# Patient Record
Sex: Female | Born: 2000
Health system: Southern US, Community
[De-identification: ages and names within clinical notes are randomized; demographics above are authoritative.]

## PROBLEM LIST (undated history)

## (undated) DIAGNOSIS — T7840XA Allergy, unspecified, initial encounter: Secondary | ICD-10-CM

## (undated) HISTORY — DX: Allergy, unspecified, initial encounter: T78.40XA

---
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
## 2020-01-10 ENCOUNTER — Encounter: Payer: Self-pay | Admitting: Physician Assistant

## 2020-01-11 DIAGNOSIS — H6121 Impacted cerumen, right ear: Secondary | ICD-10-CM | POA: Diagnosis not present

## 2020-01-11 DIAGNOSIS — H9201 Otalgia, right ear: Secondary | ICD-10-CM | POA: Diagnosis not present

## 2020-01-11 DIAGNOSIS — J0100 Acute maxillary sinusitis, unspecified: Secondary | ICD-10-CM | POA: Diagnosis not present

## 2020-01-12 NOTE — Progress Notes (Deleted)
     Established patient visit   Patient: Victoria Webb   DOB: 2000-08-23   18 y.o. Female  MRN: [id number] Visit Date: 01/13/2020  Today's healthcare provider: Margaretann Loveless, PA-C   No chief complaint on file.  Subjective    HPI ***  {Show patient history (optional):23778::" "}   Medications: Outpatient Medications Prior to Visit  Medication Sig  . cetirizine (ZYRTEC) 10 MG tablet Take 10 mg by mouth as needed.  Marland Kitchen ketoconazole (NIZORAL) 2 % shampoo APPLY 1 APPLICATION TOPICALLY 2 (TWO) TIMES A WEEK.  . terbinafine (LAMISIL) 1 % cream Apply 1 application topically 2 (two) times daily.  . TRI-LO-MARZIA 0.18/0.215/0.25 MG-25 MCG tab TAKE 1 TABLET BY MOUTH EVERY DAY   No facility-administered medications prior to visit.    Review of Systems  {Heme  Chem  Endocrine  Serology  Results Review (optional):23779::" "}  Objective    There were no vitals taken for this visit. {Show previous vital signs (optional):23777::" "}  Physical Exam  ***  No results found for any visits on 01/13/20.  Assessment & Plan     ***  No follow-ups on file.      {provider attestation***:1}   Reine Just  [facility] [phone] (phone) [phone] (fax)  [facility]

## 2020-01-13 ENCOUNTER — Other Ambulatory Visit: Payer: Self-pay

## 2020-01-13 ENCOUNTER — Ambulatory Visit: Payer: Self-pay | Admitting: Physician Assistant

## 2020-01-13 ENCOUNTER — Telehealth (INDEPENDENT_AMBULATORY_CARE_PROVIDER_SITE_OTHER): Payer: BC Managed Care – PPO | Admitting: Family Medicine

## 2020-01-13 ENCOUNTER — Encounter: Payer: Self-pay | Admitting: Family Medicine

## 2020-01-13 DIAGNOSIS — J0100 Acute maxillary sinusitis, unspecified: Secondary | ICD-10-CM

## 2020-01-13 MED ORDER — AZITHROMYCIN 250 MG PO TABS: ORAL_TABLET | ORAL | 0 refills | Status: DC

## 2020-01-13 NOTE — Progress Notes (Signed)
MyChart Video Visit    Virtual Visit via Video Note   This visit type was conducted due to national recommendations for restrictions regarding the COVID-19 Pandemic (e.g. social distancing) in an effort to limit this patient's exposure and mitigate transmission in our community. This patient is at least at moderate risk for complications without adequate follow up. This format is felt to be most appropriate for this patient at this time. Physical exam was limited by quality of the video and audio technology used for the visit.   Patient location: Home Provider location: Office   Patient: Victoria Webb   DOB: 08-Apr-2001   19 y.o. Female  MRN: [id number] Visit Date: 01/13/2020  Today's healthcare provider: Dortha Kern, PA   Chief Complaint  Patient presents with   Ear Pain   Subjective    HPI This 19 year old female developed rhinorrhea with sore throat after a trip to the beach last week. Went to an Urgent Care clinic on 01-10-20 and was given Allegra for suspected sinusitis with allergic rhinitis. Progressed to a cough with PND, ear ache, sneezing, stuffy nose and pressure under eyes. No fever or loss of taste/smell.  There are no problems to display for this patient.  Past Medical History:  Diagnosis Date   Allergy    Seasonal   No past surgical history on file. Social History   Tobacco Use   Smoking status: Never Smoker   Smokeless tobacco: Never Used  Substance Use Topics   Alcohol use: Never   Drug use: Never   Family Status  Relation Name Status   Mother  (Not Specified)   Father  (Not Specified)   Sister  (Not Specified)   Dennie Bible Aunt  (Not Specified)   MGM  (Not Specified)   MGF  (Not Specified)   PGM  (Not Specified)   PGF  (Not Specified)   Cousin  (Not Specified)   Allergies  Allergen Reactions   Augmentin [Amoxicillin-Pot Clavulanate] Nausea Only      Medications: Outpatient Medications Prior to Visit  Medication Sig     cetirizine (ZYRTEC) 10 MG tablet Take 10 mg by mouth as needed.   ketoconazole (NIZORAL) 2 % shampoo APPLY 1 APPLICATION TOPICALLY 2 (TWO) TIMES A WEEK.   terbinafine (LAMISIL) 1 % cream Apply 1 application topically 2 (two) times daily.   TRI-LO-MARZIA 0.18/0.215/0.25 MG-25 MCG tab TAKE 1 TABLET BY MOUTH EVERY DAY   No facility-administered medications prior to visit.    Review of Systems  Constitutional: Negative.   HENT: Positive for congestion, ear pain, postnasal drip, rhinorrhea, sinus pressure and sore throat.   Eyes: Negative.   Respiratory: Negative.   Cardiovascular: Negative.   Gastrointestinal: Negative.     Objective    There were no vitals taken for this visit. LMP 5 weeks ago (on BCP that cycles every 3 months.  Physical Exam: WDWN female in no apparent distress.  Head: Normocephalic, atraumatic. Neck: Supple, NROM Respiratory: No apparent distress Psych: Normal mood and affect Throat: some cobblestone appearance to posterior pharynx.   Assessment & Plan     1. Subacute maxillary sinusitis Developed maxillary sinus pressure with purulent PND/nasal mucus. No fever or other COVID symptoms. Continue Allegra, add Mucinex-DM and Z-pak. Increase fluid intake and may use Tylenol or Advil prn aches or pains. Continue BCP that 3 month cycles and made aware of possible ovulation while on antibiotics. Recheck prn. - azithromycin (ZITHROMAX) 250 MG tablet; Take 2 tablets by mouth  the first day then 1 tablet daily for 4 days to complete the full treatment.  Dispense: 6 tablet; Refill: 0   No follow-ups on file.     I discussed the assessment and treatment plan with the patient. The patient was provided an opportunity to ask questions and all were answered. The patient agreed with the plan and demonstrated an understanding of the instructions.   The patient was advised to call back or seek an in-person evaluation if the symptoms worsen or if the condition fails to  improve as anticipated.  I provided 15 minutes of non-face-to-face time during this encounter.    Vernie Murders, [place] [phone] (phone) [phone] (fax)  [place]

## 2020-01-17 DIAGNOSIS — N9089 Other specified noninflammatory disorders of vulva and perineum: Secondary | ICD-10-CM | POA: Diagnosis not present

## 2020-08-02 ENCOUNTER — Other Ambulatory Visit: Payer: BC Managed Care – PPO

## 2020-08-02 DIAGNOSIS — Z20822 Contact with and (suspected) exposure to covid-19: Secondary | ICD-10-CM

## 2020-08-04 LAB — NOVEL CORONAVIRUS, NAA: SARS-CoV-2, NAA: NOT DETECTED

## 2020-08-04 LAB — SARS-COV-2, NAA 2 DAY TAT

## 2020-08-08 ENCOUNTER — Telehealth (INDEPENDENT_AMBULATORY_CARE_PROVIDER_SITE_OTHER): Payer: BC Managed Care – PPO | Admitting: Physician Assistant

## 2020-08-08 DIAGNOSIS — J0110 Acute frontal sinusitis, unspecified: Secondary | ICD-10-CM | POA: Diagnosis not present

## 2020-08-08 DIAGNOSIS — R059 Cough, unspecified: Secondary | ICD-10-CM

## 2020-08-08 DIAGNOSIS — J069 Acute upper respiratory infection, unspecified: Secondary | ICD-10-CM

## 2020-08-08 MED ORDER — AMOXICILLIN 875 MG PO TABS: 875.0000 mg | ORAL_TABLET | Freq: Two times a day (BID) | ORAL | 0 refills | Status: AC

## 2020-08-08 MED ORDER — BENZONATATE 100 MG PO CAPS: 100.0000 mg | ORAL_CAPSULE | Freq: Two times a day (BID) | ORAL | 0 refills | Status: AC

## 2020-08-08 NOTE — Patient Instructions (Signed)

## 2020-08-08 NOTE — Progress Notes (Signed)
MyChart Video Visit    Virtual Visit via Video Note   This visit type was conducted due to national recommendations for restrictions regarding the COVID-19 Pandemic (e.g. social distancing) in an effort to limit this patient's exposure and mitigate transmission in our community. This patient is at least at moderate risk for complications without adequate follow up. This format is felt to be most appropriate for this patient at this time. Physical exam was limited by quality of the video and audio technology used for the visit.   Patient location: Home Provider location: Office   I discussed the limitations of evaluation and management by telemedicine and the availability of in person appointments. The patient expressed understanding and agreed to proceed.  Patient: Victoria Webb   DOB: 2001/06/11   19 y.o. Female  MRN: [id number] Visit Date: 08/08/2020  Today's healthcare provider: Trey Sailors, PA-C   Chief Complaint  Patient presents with  . URI  I,Victoria Webb,acting as a scribe for [employer], PA-C.,have documented all relevant documentation on the behalf of Victoria Sailors, PA-C,as directed by  Victoria Sailors, PA-C while in the presence of Victoria Sailors, PA-C.  Subjective    URI  This is a new problem. The current episode started 1 to 4 weeks ago. There has been no fever. Associated symptoms include congestion, coughing, headaches, rhinorrhea and a sore throat. Pertinent negatives include no diarrhea, ear pain, nausea, sinus pain, vomiting or wheezing. She has tried antihistamine and acetaminophen for the symptoms. The treatment provided mild relief.    She reports getting her COVID booster on 08/04/2020 and woke up with fever that night which continued since then. Reports she had some sweating last night. She reports today she has had a headache and tooth pain. She tested negative for COVID on 08/01/2020 and 08/03/2020 which was required by her school.      Medications: Outpatient Medications Prior to Visit  Medication Sig  . cetirizine (ZYRTEC) 10 MG tablet Take 10 mg by mouth as needed.  Marland Kitchen ketoconazole (NIZORAL) 2 % shampoo APPLY 1 APPLICATION TOPICALLY 2 (TWO) TIMES A WEEK.  . terbinafine (LAMISIL) 1 % cream Apply 1 application topically 2 (two) times daily.  . TRI-LO-MARZIA 0.18/0.215/0.25 MG-25 MCG tab TAKE 1 TABLET BY MOUTH EVERY DAY  . azithromycin (ZITHROMAX) 250 MG tablet Take 2 tablets by mouth the first day then 1 tablet daily for 4 days to complete the full treatment. (Patient not taking: Reported on 08/08/2020)   No facility-administered medications prior to visit.    Review of Systems  Constitutional: Positive for chills. Negative for appetite change and fatigue.  HENT: Positive for congestion, postnasal drip, rhinorrhea, sinus pressure and sore throat. Negative for ear pain and sinus pain.   Respiratory: Positive for cough. Negative for chest tightness, shortness of breath and wheezing.   Gastrointestinal: Negative for diarrhea, nausea and vomiting.  Neurological: Positive for headaches. Negative for weakness.      Objective    There were no vitals taken for this visit.   Physical Exam Constitutional:      Appearance: Normal appearance.  Pulmonary:     Effort: Pulmonary effort is normal. No respiratory distress.  Neurological:     Mental Status: She is alert.  Psychiatric:        Mood and Affect: Mood normal.        Behavior: Behavior normal.        Assessment & Plan    1.  Cough  - amoxicillin (AMOXIL) 875 MG tablet; Take 1 tablet (875 mg total) by mouth 2 (two) times daily for 10 days.  Dispense: 20 tablet; Refill: 0 - benzonatate (TESSALON PERLES) 100 MG capsule; Take 1 capsule (100 mg total) by mouth 2 (two) times daily for 7 days.  Dispense: 14 capsule; Refill: 0  2. Viral URI   3. Acute non-recurrent frontal sinusitis  Advised to wait total of 10 days before starting abx and only then if  severe worsening or double sickening.   - amoxicillin (AMOXIL) 875 MG tablet; Take 1 tablet (875 mg total) by mouth 2 (two) times daily for 10 days.  Dispense: 20 tablet; Refill: 0 - benzonatate (TESSALON PERLES) 100 MG capsule; Take 1 capsule (100 mg total) by mouth 2 (two) times daily for 7 days.  Dispense: 14 capsule; Refill: 0   No follow-ups on file.     I discussed the assessment and treatment plan with the patient. The patient was provided an opportunity to ask questions and all were answered. The patient agreed with the plan and demonstrated an understanding of the instructions.   The patient was advised to call back or seek an in-person evaluation if the symptoms worsen or if the condition fails to improve as anticipated.   ITrey Sailors, PA-C, have reviewed all documentation for this visit. The documentation on 08/08/20 for the exam, diagnosis, procedures, and orders are all accurate and complete.  The entirety of the information documented in the History of Present Illness, Review of Systems and Physical Exam were personally obtained by me. Portions of this information were initially documented by [facility] and reviewed by me for thoroughness and accuracy.    Victoria Webb [facility] [phone] (phone) [phone] (fax)  [facility]

## 2020-10-30 ENCOUNTER — Encounter: Payer: Self-pay | Admitting: Physician Assistant

## 2020-11-03 DIAGNOSIS — J310 Chronic rhinitis: Secondary | ICD-10-CM | POA: Diagnosis not present

## 2020-11-03 DIAGNOSIS — R059 Cough, unspecified: Secondary | ICD-10-CM | POA: Diagnosis not present

## 2020-11-03 DIAGNOSIS — J301 Allergic rhinitis due to pollen: Secondary | ICD-10-CM | POA: Diagnosis not present

## 2020-11-03 DIAGNOSIS — J329 Chronic sinusitis, unspecified: Secondary | ICD-10-CM | POA: Diagnosis not present

## 2020-11-30 DIAGNOSIS — Z20822 Contact with and (suspected) exposure to covid-19: Secondary | ICD-10-CM | POA: Diagnosis not present

## 2020-12-12 ENCOUNTER — Ambulatory Visit: Payer: BC Managed Care – PPO | Admitting: Family Medicine

## 2020-12-12 ENCOUNTER — Encounter: Payer: Self-pay | Admitting: Family Medicine

## 2020-12-12 ENCOUNTER — Telehealth (INDEPENDENT_AMBULATORY_CARE_PROVIDER_SITE_OTHER): Payer: BC Managed Care – PPO | Admitting: Family Medicine

## 2020-12-12 VITALS — Ht 65.0 in | Wt 155.0 lb

## 2020-12-12 DIAGNOSIS — L700 Acne vulgaris: Secondary | ICD-10-CM | POA: Diagnosis not present

## 2020-12-12 DIAGNOSIS — J0100 Acute maxillary sinusitis, unspecified: Secondary | ICD-10-CM | POA: Diagnosis not present

## 2020-12-12 MED ORDER — DOXYCYCLINE HYCLATE 100 MG PO TABS: 100.0000 mg | ORAL_TABLET | Freq: Two times a day (BID) | ORAL | 0 refills | Status: AC

## 2020-12-12 MED ORDER — CLINDAMYCIN PHOS-BENZOYL PEROX 1-5 % EX GEL
Freq: Two times a day (BID) | CUTANEOUS | 1 refills | Status: DC
Start: 2020-12-12 — End: 2021-07-09

## 2020-12-12 NOTE — Patient Instructions (Signed)
Acne  Acne is a skin problem that causes pimples and other skin changes. The skin has many tiny openings called pores. Each pore contains an oil gland. Oil glands make an oily substance that is called sebum. Acne occurs when the pores in the skin get blocked. The pores may become infected with bacteria, or they may become red, sore, and swollen. Acne is a common skin problem, especially for teenagers. It often occurs on the face, neck, chest, upper arms, and back. Acne usually goes away over time. What are the causes? Acne is caused when oil glands get blocked with sebum, dead skin cells, and dirt. The bacteria that are normally found in the oil glands then multiply and cause inflammation. Acne is commonly triggered by changes in your hormones. These hormonal changes can cause the oil glands to get bigger and to make more sebum. Factors that can make acne worse include:  Hormone changes during: ? Adolescence. ? Women's menstrual cycles. ? Pregnancy.  Oil-based cosmetics and hair products.  Stress.  Hormone problems that are caused by certain diseases.  Certain medicines.  Pressure from headbands, backpacks, or shoulder pads.  Exposure to certain oils and chemicals.  Eating a diet high in carbohydrates that quickly turn to sugar. These include dairy products, desserts, and chocolates. What increases the risk? This condition is more likely to develop in:  Teenagers.  People who have a family history of acne. What are the signs or symptoms? Symptoms include:  Small, red bumps (pimples or papules).  Whiteheads.  Blackheads.  Small, pus-filled pimples (pustules).  Big, red pimples or pustules that feel tender. More severe acne can cause:  An abscess. This is an infected area that contains a collection of pus.  Cysts. These are hard, painful, fluid-filled sacs.  Scars. These can happen after large pimples heal. How is this diagnosed? This condition is diagnosed with a  medical history and physical exam. Blood tests may also be done. How is this treated? Treatment for this condition can vary depending on the severity of your acne. Treatment may include:  Creams and lotions that prevent oil glands from clogging.  Creams and lotions that treat or prevent infections and inflammation.  Antibiotic medicines that are applied to the skin or taken as a pill.  Pills that decrease sebum production.  Birth control pills.  Light or laser treatments.  Injections of medicine into the affected areas.  Chemicals that cause peeling of the skin.  Surgery. Your health care provider will also recommend the best way to take care of your skin. Good skin care is the most important part of treatment. Follow these instructions at home: Skin care Take care of your skin as told by your health care provider. You may be told to do these things:  Wash your skin gently at least two times each day, as well as: ? After you exercise. ? Before you go to bed.  Use mild soap.  Apply a water-based skin moisturizer after you wash your skin.  Use a sunscreen or sunblock with SPF 30 or greater. This is especially important if you are using acne medicines.  Choose cosmetics that will not block your oil glands (are noncomedogenic). Medicines  Take over-the-counter and prescription medicines only as told by your health care provider.  If you were prescribed an antibiotic medicine, apply it or take it as told by your health care provider. Do not stop using the antibiotic even if your condition improves. General instructions  Keep your   hair clean and off your face. If you have oily hair, shampoo your hair regularly or daily.  Avoid wearing tight headbands or hats.  Avoid picking or squeezing your pimples. That can make your acne worse and cause scarring.  Shave gently and only when necessary.  Keep a food journal to figure out if any foods are linked to your acne. Avoid dairy  products, desserts, and chocolates.  Take steps to manage and reduce stress.  Keep all follow-up visits as told by your health care provider. This is important. Contact a health care provider if:  Your acne is not better after eight weeks.  Your acne gets worse.  You have a large area of skin that is red or tender.  You think that you are having side effects from any acne medicine. Summary  Acne is a skin problem that causes pimples and other skin changes. Acne is a common skin problem, especially for teenagers. Acne usually goes away over time.  Acne is commonly triggered by changes in your hormones. There are many other causes, such as stress, diet, and certain medicines.  Follow your health care provider's instructions for how to take care of your skin. Good skin care is the most important part of treatment.  Take over-the-counter and prescription medicines only as told by your health care provider.  Contact your health care provider if you think that you are having side effects from any acne medicine. This information is not intended to replace advice given to you by your health care provider. Make sure you discuss any questions you have with your health care provider. Document Revised: 11/25/2017 Document Reviewed: 11/25/2017 Elsevier Patient Education  2021 [employer].

## 2020-12-12 NOTE — Progress Notes (Signed)
MyChart Video Visit     Virtual Visit via Video Note   This visit type was conducted due to national recommendations for restrictions regarding the COVID-19 Pandemic (e.g. social distancing) in an effort to limit this patient's exposure and mitigate transmission in our community. This patient is at least at moderate risk for complications without adequate follow up. This format is felt to be most appropriate for this patient at this time. Physical exam was limited by quality of the video and audio technology used for the visit.    Patient location: home Provider location: [facility] Persons involved in the visit: patient, provider  I discussed the limitations of evaluation and management by telemedicine and the availability of in person appointments. The patient expressed understanding and agreed to proceed.  Patient: Victoria Webb   DOB: 2000/10/23   19 y.o. Female  MRN: [id number] Visit Date: 12/12/2020  Today's healthcare provider: Shirlee Latch, MD   Chief Complaint  Patient presents with  . Rash  . Sinusitis   Subjective    HPI  Patient reports that during the summer she gets acne on her chest, back and shoulders from using sunscreen.   Upper respiratory symptoms She complains of right ear pressure/pain, congestion and purulent nasal discharge, cough and green phlegm, with no fever, chills, night sweats or weight loss. Onset of symptoms was several days ago and worsening.She is drinking plenty of fluids.  Past history is significant for no history of pneumonia or bronchitis. Patient is non-smoker. Patient has been using Ibuprofen for body aches.  Symptoms started 7-10 days with sinus pressure No fever, SoB, wheezing Cough is productive of green sputum ---------------------------------------------------------------------------------------------------  There are no problems to display for this patient.  Social History   Tobacco Use  .  Smoking status: Never Smoker  . Smokeless tobacco: Never Used  Substance Use Topics  . Alcohol use: Never  . Drug use: Never   Allergies  Allergen Reactions  . Augmentin [Amoxicillin-Pot Clavulanate] Nausea Only      Medications: Outpatient Medications Prior to Visit  Medication Sig  . TRI-LO-MARZIA 0.18/0.215/0.25 MG-25 MCG tab TAKE 1 TABLET BY MOUTH EVERY DAY  . [DISCONTINUED] azithromycin (ZITHROMAX) 250 MG tablet Take 2 tablets by mouth the first day then 1 tablet daily for 4 days to complete the full treatment. (Patient not taking: Reported on 08/08/2020)  . [DISCONTINUED] cetirizine (ZYRTEC) 10 MG tablet Take 10 mg by mouth as needed.  . [DISCONTINUED] ketoconazole (NIZORAL) 2 % shampoo APPLY 1 APPLICATION TOPICALLY 2 (TWO) TIMES A WEEK.  . [DISCONTINUED] terbinafine (LAMISIL) 1 % cream Apply 1 application topically 2 (two) times daily.   No facility-administered medications prior to visit.    Review of Systems  Constitutional: Positive for appetite change and fatigue. Negative for chills and fever.  HENT: Positive for congestion, ear pain, rhinorrhea, sinus pressure and sore throat.   Respiratory: Negative for cough and shortness of breath.   Cardiovascular: Negative for chest pain and palpitations.  Musculoskeletal: Positive for myalgias.  Neurological: Positive for headaches.      Objective    Ht 5\' 5"  (1.651 m)   Wt 155 lb (70.3 kg)   LMP 08/28/2020 (Approximate)   BMI 25.79 kg/m  BP Readings from Last 3 Encounters:  02/04/19 111/66 (54 %, Z = 0.10 /  55 %, Z = 0.13)*  12/18/18 100/68 (15 %, Z = -1.04 /  62 %, Z = 0.31)*  09/28/18 (!) 100/63   *BP percentiles  are based on the 2017 AAP Clinical Practice Guideline for girls   Wt Readings from Last 3 Encounters:  12/12/20 155 lb (70.3 kg) (85 %, Z= 1.02)*  02/04/19 150 lb (68 kg) (85 %, Z= 1.03)*  12/17/18 153 lb (69.4 kg) (87 %, Z= 1.12)*   * Growth percentiles are based on CDC (Girls, 2-20 Years) data.       Physical Exam Constitutional:      General: She is not in acute distress.    Appearance: Normal appearance.  Pulmonary:     Effort: Pulmonary effort is normal. No respiratory distress.  Neurological:     Mental Status: She is alert and oriented to person, place, and time.  Psychiatric:        Behavior: Behavior normal.        Assessment & Plan     1. Acne vulgaris - new problem - has tried OTCs - advised on washing off sunscreen at the end of the day - avoid retinoids as they can cause sun sensitivity - trial of Benzaclin  2. Acute non-recurrent maxillary sinusitis - symptoms and exam c/w sinusitis   - no evidence of AOM, CAP, strep pharyngitis, or other infection - given duration of symptoms, suspect bacterial etiology - will treat with doxycycline x7d - discussed symptomatic management (flonase, decongestants, etc), natural course, and return precautions    Meds ordered this encounter  Medications  . clindamycin-benzoyl peroxide (BENZACLIN) gel    Sig: Apply topically 2 (two) times daily.    Dispense:  25 g    Refill:  1  . doxycycline (VIBRA-TABS) 100 MG tablet    Sig: Take 1 tablet (100 mg total) by mouth 2 (two) times daily for 7 days.    Dispense:  14 tablet    Refill:  0     Return if symptoms worsen or fail to improve.     I discussed the assessment and treatment plan with the patient. The patient was provided an opportunity to ask questions and all were answered. The patient agreed with the plan and demonstrated an understanding of the instructions.   The patient was advised to call back or seek an in-person evaluation if the symptoms worsen or if the condition fails to improve as anticipated.  I, Shirlee Latch, MD, have reviewed all documentation for this visit. The documentation on 12/12/20 for the exam, diagnosis, procedures, and orders are all accurate and complete.   Lasonja Lakins, Marzella Schlein, MD, MPH [facility]

## 2020-12-23 ENCOUNTER — Encounter: Payer: Self-pay | Admitting: Family Medicine

## 2021-05-03 ENCOUNTER — Encounter: Payer: Self-pay | Admitting: Emergency Medicine

## 2021-05-03 ENCOUNTER — Other Ambulatory Visit: Payer: Self-pay

## 2021-05-03 ENCOUNTER — Ambulatory Visit
Admission: EM | Admit: 2021-05-03 | Discharge: 2021-05-03 | Disposition: A | Discharge status: Home / Self Care | Payer: BC Managed Care – PPO | Attending: Internal Medicine | Admitting: Internal Medicine

## 2021-05-03 DIAGNOSIS — J069 Acute upper respiratory infection, unspecified: Secondary | ICD-10-CM | POA: Insufficient documentation

## 2021-05-03 DIAGNOSIS — J029 Acute pharyngitis, unspecified: Secondary | ICD-10-CM | POA: Diagnosis not present

## 2021-05-03 DIAGNOSIS — Z20822 Contact with and (suspected) exposure to covid-19: Secondary | ICD-10-CM | POA: Diagnosis not present

## 2021-05-03 LAB — GROUP A STREP BY PCR: Group A Strep by PCR: NOT DETECTED

## 2021-05-03 NOTE — ED Provider Notes (Signed)
[facility]    CSN: [id number] Arrival date & time: 05/03/21  1052      History   Chief Complaint Chief Complaint  Patient presents with   Nasal Congestion   Sore Throat   Otalgia    HPI Victoria Webb is a 20 y.o. female who presents with nose congestion, ST and bilateral ear pain x 2 days. Has had low grade temp of 99 last night. Had a [facility] test this [facility] at [facility], but results are pending. ST causes her not to want to eat solid things due to the pain.     Past Medical History:  Diagnosis Date   Allergy    Seasonal    There are no problems to display for this patient.   History reviewed. No pertinent surgical history.  OB History   No obstetric history on file.      Home Medications    Prior to Admission medications   Medication Sig Start Date End Date Taking? Authorizing Provider  clindamycin-benzoyl peroxide (BENZACLIN) gel Apply topically 2 (two) times daily. 12/12/20   Bacigalupo, Marzella Schlein, MD  TRI-LO-MARZIA 0.18/0.215/0.25 MG-25 MCG tab TAKE 1 TABLET BY MOUTH EVERY DAY 07/10/18   [provider]    Family History Family History  Problem Relation Age of Onset   Anxiety disorder Mother    Migraines Mother    Cancer Father    Kidney Stones Sister    Anxiety disorder Sister    Asthma Sister    Migraines Sister    Food Allergy Sister    Mental illness Paternal Aunt    Sleep apnea Maternal Grandmother    Dementia Maternal Grandfather    Cancer Paternal Grandmother    Mental illness Paternal Grandmother    Stroke Paternal Grandfather    Diabetes type II Paternal Grandfather    Anxiety disorder Cousin    ADD / ADHD Cousin     Social History Social History   Tobacco Use   Smoking status: Never   Smokeless tobacco: Never  Vaping Use   Vaping Use: Never used  Substance Use Topics   Alcohol use: Never   Drug use: Never     Allergies   Augmentin [amoxicillin-pot clavulanate]   Review of Systems Review of Systems +  fatigue, nl appetite   Physical Exam Triage Vital Signs ED Triage Vitals  Enc Vitals Group     BP 05/03/21 1158 103/65     Pulse Rate 05/03/21 1158 84     Resp 05/03/21 1158 18     Temp 05/03/21 1158 98.4 F (36.9 C)     Temp Source 05/03/21 1158 Oral     SpO2 05/03/21 1158 100 %     Weight --      Height --      Head Circumference --      Peak Flow --      Pain Score 05/03/21 1155 5     Pain Loc --      Pain Edu? --      Excl. in GC? --    No data found.  Updated Vital Signs BP 103/65 (BP Location: Right Arm)   Pulse 84   Temp 98.4 F (36.9 C) (Oral)   Resp 18   LMP 03/29/2021 (Approximate)   SpO2 100%   Visual Acuity Right Eye Distance:   Left Eye Distance:   Bilateral Distance:    Right Eye Near:   Left Eye Near:    Bilateral Near:  Physical Exam Vitals signs and nursing note reviewed.  Constitutional:      General: She is not in acute distress.    Appearance: Normal appearance. She is not ill-appearing, toxic-appearing or diaphoretic.  HENT:     Head: Normocephalic.     Right Ear: Tympanic membrane, ear canal and external ear normal.     Left Ear: Tympanic membrane, ear canal and external ear normal.     Nose: clear rhinitis    Mouth/Throat: clear    Mouth: Mucous membranes are moist.  Eyes:     General: No scleral icterus.       Right eye: No discharge.        Left eye: No discharge.     Conjunctiva/sclera: Conjunctivae normal.  Neck:     Musculoskeletal: Neck supple. No neck rigidity.  Cardiovascular:     Rate and Rhythm: Normal rate and regular rhythm.     Heart sounds: No murmur.  Pulmonary:     Effort: Pulmonary effort is normal.     Breath sounds: Normal breath sounds.   Musculoskeletal: Normal range of motion.  Lymphadenopathy:     Cervical: No cervical adenopathy.  Skin:    General: Skin is warm and dry.     Coloration: Skin is not jaundiced.     Findings: No rash.  Neurological:     Mental Status: She is alert and oriented  to person, place, and time.     Gait: Gait normal.  Psychiatric:        Mood and Affect: Mood normal.        Behavior: Behavior normal.        Thought Content: Thought content normal.        Judgment: Judgment normal.    UC Treatments / Results  Labs (all labs ordered are listed, but only abnormal results are displayed) Labs Reviewed  GROUP A STREP BY PCR  PCR strep test is neg EKG   Radiology No results found.  Procedures Procedures (including critical care time)  Medications Ordered in UC Medications - No data to display  Initial Impression / Assessment and Plan / UC Course  I have reviewed the triage vital signs and the nursing notes. Pertinent labs  results that were available during my care of the patient were reviewed by me and considered in my medical decision making (see chart for details). Viral URI and pharyngitis See instructions.     Final Clinical Impressions(s) / UC Diagnoses   Final diagnoses:  Viral pharyngitis  Viral URI with cough     Discharge Instructions      You do not have a bacterial infection, only cold type symptoms or covid You may take Dayquil and or Nyquil as needed for your symptoms I will call you when the strep test is back.      ED Prescriptions   None    PDMP not reviewed this encounter.   Garey Ham, PA-C 05/03/21 1458

## 2021-05-03 NOTE — Discharge Instructions (Addendum)
You do not have a bacterial infection, only cold type symptoms or covid You may take Dayquil and or Nyquil as needed for your symptoms I will call you when the strep test is back.

## 2021-05-03 NOTE — ED Triage Notes (Signed)
Pt presents today with c/o of nasal congestion, sore throat and bilateral ear pain x 2 days. Low grade fever. Ibuprofen taken this at 9:30 a.m. She [facility].

## 2021-05-09 DIAGNOSIS — J029 Acute pharyngitis, unspecified: Secondary | ICD-10-CM | POA: Diagnosis not present

## 2021-05-09 DIAGNOSIS — N898 Other specified noninflammatory disorders of vagina: Secondary | ICD-10-CM | POA: Diagnosis not present

## 2021-05-11 DIAGNOSIS — Z3009 Encounter for other general counseling and advice on contraception: Secondary | ICD-10-CM | POA: Diagnosis not present

## 2021-05-12 DIAGNOSIS — R5383 Other fatigue: Secondary | ICD-10-CM | POA: Diagnosis not present

## 2021-05-14 DIAGNOSIS — Z20828 Contact with and (suspected) exposure to other viral communicable diseases: Secondary | ICD-10-CM | POA: Diagnosis not present

## 2021-05-17 ENCOUNTER — Ambulatory Visit: Payer: Self-pay | Admitting: *Deleted

## 2021-05-17 NOTE — Telephone Encounter (Signed)
I attempted to return the call the Wallene Huh, (pt's mother).   Left a voicemail for her to return the call to [facility] and the number.

## 2021-05-17 NOTE — Telephone Encounter (Signed)
Left VM on moms phone to take her to UC, ED or do a virtual appt through [email or web address]. Also told her to be checked again for Covid.  There are no available appts in [facility] and we cannot perform strep or Covid test.

## 2021-05-17 NOTE — Telephone Encounter (Signed)
Reason for Disposition  [1] Pus on tonsils (back of throat) AND [2]  fever AND [3] swollen neck lymph nodes ("glands")  Answer Assessment - Initial Assessment Questions 1. ONSET: "When did the throat start hurting?" (Hours or days ago)      I returned call to mother Victoria Webb.    Victoria Webb has mono and a sore throat.  [facility].   They called in Lidocaine stuff for her throat.   Her spleen is enlarged.   She's avoiding Tylenol because of her liver.  I'm concerned about the pain in her throat.  Her tonsils are swollen and have white pus on them. 2. SEVERITY: "How bad is the sore throat?" (Scale 1-10; mild, moderate or severe)   - MILD (1-3):  doesn't interfere with eating or normal activities   - MODERATE (4-7): interferes with eating some solids and normal activities   - SEVERE (8-10):  excruciating pain, interferes with most normal activities   - SEVERE DYSPHAGIA: can't swallow liquids, drooling     Severe hurts bad to eat and swallow.   3. STREP EXPOSURE: "Has there been any exposure to strep within the past week?" If Yes, ask: "What type of contact occurred?"      2 weekends ago she got sick.  The strep and flu and Covid tests came back negative.   She hasn't fully recovered.   She has been seen at [facility] 5 times now.    They put her on amoxicillin and broke out in hives over most of her body.   That's how she figured out she has mono is from the rash and looking it up on Google.   She's taking Zyrtec.     They did a mono test at student health and got the result yesterday and it's positive for mono.    She goes back to college on Sunday.   4.  VIRAL SYMPTOMS: "Are there any symptoms of a cold, such as a runny nose, cough, hoarse voice or red eyes?"      She wakes up in sweat.   She did not have a fever when checked it.   A mild runny nose.   No coughing. 5. FEVER: "Do you have a fever?" If Yes, ask: "What is your temperature, how was it measured, and  when did it start?"     No fever.      She came home Tuesday night from college.   The mucus was like a Sport and exercise psychologist. 6. PUS ON THE TONSILS: "Is there pus on the tonsils in the back of your throat?"     YES 7. OTHER SYMPTOMS: "Do you have any other symptoms?" (e.g., difficulty breathing, headache, rash)     Rash see above. 8. PREGNANCY: "Is there any chance you are pregnant?" "When was your last menstrual period?"     Not asked  Protocols used: Sore Throat-A-AH I am sending a request to [facility] to see if they can work this pt in for Friday 05/18/2021 while she is home from school.

## 2021-06-05 DIAGNOSIS — Z113 Encounter for screening for infections with a predominantly sexual mode of transmission: Secondary | ICD-10-CM | POA: Diagnosis not present

## 2021-07-06 ENCOUNTER — Ambulatory Visit: Payer: Self-pay | Admitting: *Deleted

## 2021-07-06 NOTE — Telephone Encounter (Signed)
  Chief Complaint: a itchy rash on the back of both arms for the last 5 days. Symptoms: itching, rough feeling skin with small bumps felt Frequency: all the time for the last 5 days Pertinent Negatives: Patient denies bleeding or pain. Disposition: [] ED /[] Urgent Care (no appt availability in office) / Appointment(In office/virtual)/ []  [facility]/ [] Home Care/ [] Refused Recommended Disposition  Additional Notes: Pt warm transferred to [facility] in [facility] to be scheduled on day next week with a provider.   Pt is leaving town shortly and is requesting the appt for next week.

## 2021-07-06 NOTE — Telephone Encounter (Signed)
Pt calling in.    Reason for Disposition  Localized rash present > 7 days  Answer Assessment - Initial Assessment Questions 1. APPEARANCE of RASH: "Describe the rash."      My both arms are broke out in bumps.  Also on my legs.  It's barely there on my legs. I'm wondering if it's related to the prednisone and it is per what I looked up on line. 2. LOCATION: "Where is the rash located?"      Both arms around my elbows and 1/2 way up to my shoulder.   It's bumps on the back of my arms.   It is itching.  They are a little raised.   I can run my fingers over my skin and it feels rough. 3. NUMBER: "How many spots are there?"      Not asked  4. SIZE: "How big are the spots?" (Inches, centimeters or compare to size of a coin)      Very small 5. ONSET: "When did the rash start?"      5 days ago.   I thought my skin was dry.  It's itching.    6. ITCHING: "Does the rash itch?" If Yes, ask: "How bad is the itch?"  (Scale 0-10; or none, mild, moderate, severe)     Yes I tried lotion for rough and dry skin and for clogged pores. 7. PAIN: "Does the rash hurt?" If Yes, ask: "How bad is the pain?"  (Scale 0-10; or none, mild, moderate, severe)    - NONE (0): no pain    - MILD (1-3): doesn't interfere with normal activities     - MODERATE (4-7): interferes with normal activities or awakens from sleep     - SEVERE (8-10): excruciating pain, unable to do any normal activities     No pain 8. OTHER SYMPTOMS: "Do you have any other symptoms?" (e.g., fever)     No 9. PREGNANCY: "Is there any chance you are pregnant?" "When was your last menstrual period?"     No!  Protocols used: Rash or Redness - Localized-A-AH

## 2021-07-09 ENCOUNTER — Encounter: Payer: Self-pay | Admitting: Family Medicine

## 2021-07-09 ENCOUNTER — Ambulatory Visit (INDEPENDENT_AMBULATORY_CARE_PROVIDER_SITE_OTHER): Payer: BC Managed Care – PPO | Admitting: Family Medicine

## 2021-07-09 ENCOUNTER — Other Ambulatory Visit: Payer: Self-pay

## 2021-07-09 VITALS — BP 126/78 | HR 81 | Temp 98.4°F | Resp 16 | Ht 65.0 in | Wt 168.0 lb

## 2021-07-09 DIAGNOSIS — R21 Rash and other nonspecific skin eruption: Secondary | ICD-10-CM | POA: Diagnosis not present

## 2021-07-09 NOTE — Progress Notes (Signed)
   SUBJECTIVE:   CHIEF COMPLAINT / HPI:   RASH Duration:  1 week Location: arms, legs Itching: yes Burning:  only with scratching Redness: yes Oozing: no Scaling: no Blisters: no Painful: no Fevers: no Change in detergents/soaps/personal care products: no Recent illness: no History of same: no Alleviating factors: salicylic acid Treatments attempted: salicylic acid Shortness of breath: no  Throat/tongue swelling: no Myalgias/arthralgias: no   OBJECTIVE:   BP 126/78 (BP Location: Right Arm, Patient Position: Sitting, Cuff Size: Normal)   Pulse 81   Temp 98.4 F (36.9 C) (Temporal)   Resp 16   Ht 5\' 5"  (1.651 m)   Wt 168 lb (76.2 kg)   SpO2 98%   BMI 27.96 kg/m   Gen: well appearing, in NAD Skin: fine papular rash to extensor surface of bilateral upper arms with few excoriations. Very few lesions to bilateral thighs.   ASSESSMENT/PLAN:   Rash Appears eczematous but with no prior history of same and unknown trigger. Was treated for mono with prednisone but was >1 month ago, doubt contributing. Treat conservatively with OTC steroid cream, continue salicylic acid. F/u prn.    , DO

## 2021-08-23 DIAGNOSIS — F4322 Adjustment disorder with anxiety: Secondary | ICD-10-CM | POA: Diagnosis not present

## 2021-08-30 DIAGNOSIS — F4322 Adjustment disorder with anxiety: Secondary | ICD-10-CM | POA: Diagnosis not present

## 2021-09-06 DIAGNOSIS — F4322 Adjustment disorder with anxiety: Secondary | ICD-10-CM | POA: Diagnosis not present

## 2021-09-18 DIAGNOSIS — F4322 Adjustment disorder with anxiety: Secondary | ICD-10-CM | POA: Diagnosis not present

## 2021-09-27 DIAGNOSIS — F4322 Adjustment disorder with anxiety: Secondary | ICD-10-CM | POA: Diagnosis not present

## 2021-10-04 DIAGNOSIS — F4322 Adjustment disorder with anxiety: Secondary | ICD-10-CM | POA: Diagnosis not present

## 2021-10-18 DIAGNOSIS — F4322 Adjustment disorder with anxiety: Secondary | ICD-10-CM | POA: Diagnosis not present

## 2021-10-25 DIAGNOSIS — F4322 Adjustment disorder with anxiety: Secondary | ICD-10-CM | POA: Diagnosis not present

## 2021-11-08 DIAGNOSIS — F4322 Adjustment disorder with anxiety: Secondary | ICD-10-CM | POA: Diagnosis not present

## 2021-11-15 DIAGNOSIS — F4322 Adjustment disorder with anxiety: Secondary | ICD-10-CM | POA: Diagnosis not present

## 2021-11-22 DIAGNOSIS — F4322 Adjustment disorder with anxiety: Secondary | ICD-10-CM | POA: Diagnosis not present

## 2022-01-16 DIAGNOSIS — L700 Acne vulgaris: Secondary | ICD-10-CM | POA: Diagnosis not present

## 2022-02-18 ENCOUNTER — Telehealth: Payer: Self-pay

## 2022-02-18 ENCOUNTER — Other Ambulatory Visit: Payer: Self-pay | Admitting: Family Medicine

## 2022-02-18 DIAGNOSIS — Z111 Encounter for screening for respiratory tuberculosis: Secondary | ICD-10-CM

## 2022-02-18 NOTE — Telephone Encounter (Signed)
Copied from CRM [id number]. Topic: General - Other >> Feb 18, 2022 12:20 PM Everette C wrote: Reason for CRM: The patient has called to request orders for an IGRA blood test for tuberculosis   The patient would like the orders placed in their chart so the test can be done at the [facility] facility of their choosing   Please contact the patient further if needed

## 2022-02-20 ENCOUNTER — Telehealth: Payer: Self-pay | Admitting: Family Medicine

## 2022-02-20 NOTE — Telephone Encounter (Signed)
Pt called to verify TB blood work orders were ordered to [facility]. Verified lab orders to pt.

## 2022-02-21 DIAGNOSIS — Z111 Encounter for screening for respiratory tuberculosis: Secondary | ICD-10-CM | POA: Diagnosis not present

## 2022-02-26 LAB — QUANTIFERON-TB GOLD PLUS
QuantiFERON Mitogen Value: >10 IU/mL
QuantiFERON Nil Value: 0.04 IU/mL
QuantiFERON TB1 Ag Value: 0.05 IU/mL
QuantiFERON TB2 Ag Value: 0.05 IU/mL
QuantiFERON-TB Gold Plus: NEGATIVE

## 2022-02-26 NOTE — Progress Notes (Signed)
Negative blood work for TB.  Please let us know if you have any questions.  Thank you, Jacky Kindle, FNP  [facility] [address] [place], [place] [address] [phone] (phone) [phone] (fax) [facility]

## 2022-03-06 ENCOUNTER — Ambulatory Visit: Payer: BC Managed Care – PPO | Admitting: Family Medicine

## 2022-03-06 ENCOUNTER — Encounter: Payer: Self-pay | Admitting: Family Medicine

## 2022-03-06 VITALS — BP 110/71 | HR 76 | Temp 98.5°F | Resp 16 | Ht 65.0 in | Wt 171.0 lb

## 2022-03-06 DIAGNOSIS — J3089 Other allergic rhinitis: Secondary | ICD-10-CM

## 2022-03-06 DIAGNOSIS — H60393 Other infective otitis externa, bilateral: Secondary | ICD-10-CM | POA: Diagnosis not present

## 2022-03-06 DIAGNOSIS — J309 Allergic rhinitis, unspecified: Secondary | ICD-10-CM | POA: Insufficient documentation

## 2022-03-06 DIAGNOSIS — H6090 Unspecified otitis externa, unspecified ear: Secondary | ICD-10-CM | POA: Insufficient documentation

## 2022-03-06 MED ORDER — AZITHROMYCIN 500 MG PO TABS: 500.0000 mg | ORAL_TABLET | Freq: Every day | ORAL | 0 refills | Status: DC

## 2022-03-06 MED ORDER — DOXYCYCLINE HYCLATE 100 MG PO TABS: 100.0000 mg | ORAL_TABLET | Freq: Two times a day (BID) | ORAL | 0 refills | Status: DC

## 2022-03-06 MED ORDER — NEOMYCIN-POLYMYXIN-HC 3.5-10000-1 OT SOLN: 4.0000 [drp] | Freq: Four times a day (QID) | OTIC | 1 refills | Status: DC

## 2022-03-06 NOTE — Assessment & Plan Note (Signed)
Chronic, variable Has tried Zyrtec and Flonase in the past; prefers to get OTC Encouraged to use routinely with seasonal triggers or pet triggers

## 2022-03-06 NOTE — Assessment & Plan Note (Signed)
Chronic, recurrent Recent flare, within the last 7-10 days; no scabbing from overnight scratching Encouraged to be mindful of allergy symptoms and do routine treatment vs sporadic treatment

## 2022-03-06 NOTE — Progress Notes (Signed)
Established patient visit  Patient: Victoria Webb   DOB: 15-Feb-2001   20 y.o. Female  MRN: [id number] Visit Date: 03/06/2022  Today's healthcare provider: Jacky Kindle, FNP  Patient presents for new patient visit to establish care.  Introduced to Publishing rights manager role and practice setting.  All questions answered.  Discussed provider/patient relationship and expectations.  I,Tiffany J Bragg,acting as a scribe for Jacky Kindle, FNP.,have documented all relevant documentation on the behalf of Jacky Kindle, FNP,as directed by  Jacky Kindle, FNP while in the presence of Jacky Kindle, FNP.  Chief Complaint  Patient presents with   Ear Pain    Patient complains of ears itching for 3 months. States she itches them in her sleep and causes bleeding, mom sent her in to get something figured out before school starts Monday.    Subjective    HPI HPI     Ear Pain    Additional comments: Patient complains of ears itching for 3 months. States she itches them in her sleep and causes bleeding, mom sent her in to get something figured out before school starts Monday.       Last edited by Marlana Salvage, CMA on 03/06/2022 11:13 AM.      Upper respiratory symptoms She complains of bilateral ear pressure/pain.with no fever, chills, night sweats or weight loss. Onset of symptoms was several months ago and  variable symptoms .She is drinking plenty of fluids.  Past history is significant for no history of pneumonia or bronchitis. Patient is non-smoker  ---------------------------------------------------------------------------------------------------   Medications: Outpatient Medications Prior to Visit  Medication Sig   ACZONE 7.5 % GEL Apply topically.   Levonorgestrel-Ethinyl Estradiol (AMETHIA) 0.15-0.03 &0.01 MG tablet Take by mouth.   No facility-administered medications prior to visit.    Review of Systems  Last CBC Lab Results  Component Value Date   WBC 4.2 (L)  12/17/2018   HGB 15.2 12/17/2018   HCT 45.1 12/17/2018   MCV 92.2 12/17/2018   MCH 31.1 12/17/2018   RDW 12.6 12/17/2018   PLT 187 12/17/2018   Last metabolic panel Lab Results  Component Value Date   GLUCOSE 84 12/17/2018   NA 137 12/17/2018   K 4.1 12/17/2018   CL 103 12/17/2018   CO2 23 12/17/2018   BUN 7 12/17/2018   CREATININE 0.65 12/17/2018   GFRNONAA NOT CALCULATED 12/17/2018   CALCIUM 9.0 12/17/2018   PROT 7.4 12/17/2018   ALBUMIN 4.1 12/17/2018   BILITOT 0.4 12/17/2018   ALKPHOS 54 12/17/2018   AST 20 12/17/2018   ALT 12 12/17/2018   ANIONGAP 11 12/17/2018   Last lipids No results found for: "CHOL", "HDL", "LDLCALC", "LDLDIRECT", "TRIG", "CHOLHDL" Last hemoglobin A1c No results found for: "HGBA1C" Last thyroid functions No results found for: "TSH", "T3TOTAL", "T4TOTAL", "THYROIDAB" Last vitamin D No results found for: "25OHVITD2", "25OHVITD3", "VD25OH" Last vitamin B12 and Folate No results found for: "VITAMINB12", "FOLATE"     Objective    BP 110/71 (BP Location: Left Arm, Patient Position: Sitting, Cuff Size: Normal)   Pulse 76   Temp 98.5 F (36.9 C) (Oral)   Resp 16   Ht 5\' 5"  (1.651 m)   Wt 171 lb (77.6 kg)   SpO2 96%   BMI 28.46 kg/m   BP Readings from Last 3 Encounters:  03/06/22 110/71  07/09/21 126/78  05/03/21 103/65   Wt Readings from Last 3 Encounters:  03/06/22 171 lb (77.6 kg)  07/09/21 168  lb (76.2 kg)  12/12/20 155 lb (70.3 kg) (85 %, Z= 1.02)*   * Growth percentiles are based on CDC (Girls, 2-20 Years) data.   SpO2 Readings from Last 3 Encounters:  03/06/22 96%  07/09/21 98%  05/03/21 100%      Physical Exam Vitals and nursing note reviewed.  Constitutional:      General: She is not in acute distress.    Appearance: Normal appearance. She is overweight. She is not ill-appearing, toxic-appearing or diaphoretic.  HENT:     Head: Normocephalic and atraumatic.     Right Ear: Decreased hearing noted. Drainage present.  A middle ear effusion is present.     Left Ear: Decreased hearing noted. Drainage present. A middle ear effusion is present.     Ears:     Comments: Yellow thick discharge present in both ears Cardiovascular:     Rate and Rhythm: Normal rate and regular rhythm.     Pulses: Normal pulses.     Heart sounds: Normal heart sounds. No murmur heard.    No friction rub. No gallop.  Pulmonary:     Effort: Pulmonary effort is normal. No respiratory distress.     Breath sounds: Normal breath sounds. No stridor. No wheezing, rhonchi or rales.  Chest:     Chest wall: No tenderness.  Abdominal:     General: Bowel sounds are normal.     Palpations: Abdomen is soft.  Musculoskeletal:        General: No swelling, tenderness, deformity or signs of injury. Normal range of motion.     Right lower leg: No edema.     Left lower leg: No edema.  Skin:    General: Skin is warm and dry.     Capillary Refill: Capillary refill takes less than 2 seconds.     Coloration: Skin is not jaundiced or pale.     Findings: No bruising, erythema, lesion or rash.  Neurological:     General: No focal deficit present.     Mental Status: She is alert and oriented to person, place, and time. Mental status is at baseline.     Cranial Nerves: No cranial nerve deficit.     Sensory: No sensory deficit.     Motor: No weakness.     Coordination: Coordination normal.  Psychiatric:        Mood and Affect: Mood normal.        Behavior: Behavior normal.        Thought Content: Thought content normal.        Judgment: Judgment normal.    No results found for any visits on 03/06/22.  Assessment & Plan     Problem List Items Addressed This Visit       Respiratory   Allergic rhinitis due to allergen    Chronic, variable Has tried Zyrtec and Flonase in the past; prefers to get OTC Encouraged to use routinely with seasonal triggers or pet triggers         Nervous and Auditory   Otitis externa - Primary    Chronic,  recurrent Recent flare, within the last 7-10 days; no scabbing from overnight scratching Encouraged to be mindful of allergy symptoms and do routine treatment vs sporadic treatment       Relevant Medications   neomycin-polymyxin-hydrocortisone (CORTISPORIN) OTIC solution   doxycycline (VIBRA-TABS) 100 MG tablet   azithromycin (ZITHROMAX) 500 MG tablet   Return if symptoms worsen or fail to improve- can refer to ENT as  well.     Leilani Merl, FNP, have reviewed all documentation for this visit. The documentation on 03/06/22 for the exam, diagnosis, procedures, and orders are all accurate and complete.  Jacky Kindle, FNP  [facility] [phone] (phone) [phone] (fax)  [facility]

## 2022-03-31 ENCOUNTER — Encounter: Payer: Self-pay | Admitting: Family Medicine

## 2022-05-03 DIAGNOSIS — R399 Unspecified symptoms and signs involving the genitourinary system: Secondary | ICD-10-CM | POA: Diagnosis not present

## 2022-05-03 DIAGNOSIS — L0291 Cutaneous abscess, unspecified: Secondary | ICD-10-CM | POA: Diagnosis not present

## 2022-05-09 DIAGNOSIS — T7840XA Allergy, unspecified, initial encounter: Secondary | ICD-10-CM | POA: Diagnosis not present

## 2022-05-09 DIAGNOSIS — S134XXA Sprain of ligaments of cervical spine, initial encounter: Secondary | ICD-10-CM | POA: Diagnosis not present

## 2022-07-05 ENCOUNTER — Encounter: Payer: Self-pay | Admitting: Family Medicine

## 2022-07-08 ENCOUNTER — Other Ambulatory Visit: Payer: Self-pay | Admitting: Family Medicine

## 2022-07-08 DIAGNOSIS — L309 Dermatitis, unspecified: Secondary | ICD-10-CM

## 2022-08-04 DIAGNOSIS — R519 Headache, unspecified: Secondary | ICD-10-CM | POA: Diagnosis not present

## 2022-08-04 DIAGNOSIS — Z03818 Encounter for observation for suspected exposure to other biological agents ruled out: Secondary | ICD-10-CM | POA: Diagnosis not present

## 2022-08-04 DIAGNOSIS — J00 Acute nasopharyngitis [common cold]: Secondary | ICD-10-CM | POA: Diagnosis not present

## 2022-08-04 DIAGNOSIS — R051 Acute cough: Secondary | ICD-10-CM | POA: Diagnosis not present

## 2022-08-19 DIAGNOSIS — R000 Tachycardia, unspecified: Secondary | ICD-10-CM | POA: Diagnosis not present

## 2022-08-19 DIAGNOSIS — J039 Acute tonsillitis, unspecified: Secondary | ICD-10-CM | POA: Diagnosis not present

## 2022-08-19 DIAGNOSIS — R509 Fever, unspecified: Secondary | ICD-10-CM | POA: Diagnosis not present

## 2023-02-03 ENCOUNTER — Ambulatory Visit: Payer: Self-pay

## 2023-02-03 NOTE — Telephone Encounter (Signed)
Pt called in has puffiness in face and hair falling out   Chief Complaint: Hair loss, occasional facial "puffiness." Symptoms: Above Frequency: 6 months ago Pertinent Negatives: Patient denies any new medications or hair products. Disposition: [] ED /[] Urgent Care (no appt availability in office) / [x] Appointment(In office/virtual)/ []  [place] [facility]/ [] Home Care/ [] Refused Recommended Disposition /[] [place] [facility]/ []  Follow-up with PCP Additional Notes: Has had increased stress from school and work.  Reason for Disposition  [1] Hair thinning, hair loss, or balding AND [2] cause not known (e.g., no recent precipitating factors such as childbirth, weight loss surgery)  Answer Assessment - Initial Assessment Questions 1. LOCATION: "Where is the hair loss?" (e.g., all of scalp, parts of scalp, back of head or neck)     All over 2. DESCRIPTION: "Please describe it.? (e.g., thinning of hair, balding, patches of hair missing)     Thinning  3. ONSET: "When did the hair loss begin?" (e.g., sudden or gradual onset; days, weeks, months or years ago)     6 months ago 4. OTHER SYMPTOMS: "What does the scalp look like where the hair is missing?" (e.g., normal, redness, crusts, scarring)     No 5. OTHER FACTORS: "Have you had any of the following recently: childbirth, severe illness or injury, major surgery, major weight loss, cancer chemo, tight hair braids, serious stress?"     Stress from school and work 6. CAUSE: "What do you think is causing the hair loss?"     Unsure  Protocols used: Hair Loss-A-AH

## 2023-02-06 ENCOUNTER — Ambulatory Visit: Payer: BC Managed Care – PPO | Admitting: Family Medicine

## 2023-02-06 ENCOUNTER — Encounter: Payer: Self-pay | Admitting: Family Medicine

## 2023-02-06 VITALS — BP 109/67 | HR 70 | Ht 65.5 in | Wt 171.0 lb

## 2023-02-06 DIAGNOSIS — Z3041 Encounter for surveillance of contraceptive pills: Secondary | ICD-10-CM | POA: Insufficient documentation

## 2023-02-06 DIAGNOSIS — R5383 Other fatigue: Secondary | ICD-10-CM | POA: Insufficient documentation

## 2023-02-06 DIAGNOSIS — L659 Nonscarring hair loss, unspecified: Secondary | ICD-10-CM | POA: Diagnosis not present

## 2023-02-06 DIAGNOSIS — L700 Acne vulgaris: Secondary | ICD-10-CM

## 2023-02-06 MED ORDER — SPIRONOLACTONE 25 MG PO TABS: 25.0000 mg | ORAL_TABLET | Freq: Every day | ORAL | 11 refills | Status: AC

## 2023-02-06 MED ORDER — LEVONORGEST-ETH ESTRAD 91-DAY 0.15-0.03 &0.01 MG PO TABS: 1.0000 | ORAL_TABLET | Freq: Every day | ORAL | 4 refills | Status: AC

## 2023-02-06 NOTE — Assessment & Plan Note (Signed)
Acute on chronic; reports previous topicals are too drying Trial of spiro to assist F/u as needed

## 2023-02-06 NOTE — Assessment & Plan Note (Signed)
Ongoing issue; believes she's lost '1/2 of my hair' since I was in high school Repeat labs including tsh, cbc, vits etc

## 2023-02-06 NOTE — Progress Notes (Signed)
Established patient visit  Patient: Victoria Webb   DOB: 25-Jun-2001   22 y.o. Female  MRN: [id number] Visit Date: 02/06/2023  Today's healthcare provider: Jacky Kindle, FNP  Introduced to nurse practitioner role and practice setting.  All questions answered.  Discussed provider/patient relationship and expectations.  Chief Complaint  Patient presents with   Hair/Scalp Problem    Pt stated--hair loss, pimple especially lower jaw--   Subjective    HPI HPI     Hair/Scalp Problem    Additional comments: Pt stated--hair loss, pimple especially lower jaw--      Last edited by Shelly Bombard, CMA on 02/06/2023  9:27 AM.      Medications: Outpatient Medications Prior to Visit  Medication Sig   [DISCONTINUED] ACZONE 7.5 % GEL Apply topically.   [DISCONTINUED] azithromycin (ZITHROMAX) 500 MG tablet Take 1 tablet (500 mg total) by mouth daily.   [DISCONTINUED] doxycycline (VIBRA-TABS) 100 MG tablet Take 1 tablet (100 mg total) by mouth 2 (two) times daily.   [DISCONTINUED] Levonorgestrel-Ethinyl Estradiol (AMETHIA) 0.15-0.03 &0.01 MG tablet Take by mouth.   [DISCONTINUED] neomycin-polymyxin-hydrocortisone (CORTISPORIN) OTIC solution Place 4 drops into both ears 4 (four) times daily.   No facility-administered medications prior to visit.    Review of Systems   Objective    BP 109/67 (BP Location: Right Arm, Patient Position: Sitting, Cuff Size: Normal)   Pulse 70   Ht 5' 5.5" (1.664 m)   Wt 171 lb (77.6 kg)   LMP 01/07/2023   SpO2 98%   BMI 28.02 kg/m   Physical Exam Vitals and nursing note reviewed.  Constitutional:      General: She is not in acute distress.    Appearance: Normal appearance. She is overweight. She is not ill-appearing, toxic-appearing or diaphoretic.  HENT:     Head: Normocephalic and atraumatic.  Cardiovascular:     Rate and Rhythm: Normal rate and regular rhythm.     Pulses: Normal pulses.     Heart sounds: Normal heart sounds. No murmur  heard.    No friction rub. No gallop.  Pulmonary:     Effort: Pulmonary effort is normal. No respiratory distress.     Breath sounds: Normal breath sounds. No stridor. No wheezing, rhonchi or rales.  Chest:     Chest wall: No tenderness.  Abdominal:     General: Bowel sounds are normal.     Palpations: Abdomen is soft.  Musculoskeletal:        General: No swelling, tenderness, deformity or signs of injury. Normal range of motion.     Right lower leg: No edema.     Left lower leg: No edema.  Skin:    General: Skin is warm and dry.     Capillary Refill: Capillary refill takes less than 2 seconds.     Coloration: Skin is not jaundiced or pale.     Findings: Lesion present. No bruising, erythema or rash.     Comments: Minor, jaw line acne Reports gradual hair loss over last 3-4 years   Neurological:     General: No focal deficit present.     Mental Status: She is alert and oriented to person, place, and time. Mental status is at baseline.     Cranial Nerves: No cranial nerve deficit.     Sensory: No sensory deficit.     Motor: No weakness.     Coordination: Coordination normal.  Psychiatric:        Mood and Affect: Mood  normal.        Behavior: Behavior normal.        Thought Content: Thought content normal.        Judgment: Judgment normal.     No results found for any visits on 02/06/23.  Assessment & Plan     Problem List Items Addressed This Visit       Musculoskeletal and Integument   Acne vulgaris    Acute on chronic; reports previous topicals are too drying Trial of spiro to assist F/u as needed       Relevant Medications   spironolactone (ALDACTONE) 25 MG tablet   Levonorgestrel-Ethinyl Estradiol (AMETHIA) 0.15-0.03 &0.01 MG tablet     Other   Hair loss    Ongoing issue; believes she's lost '1/2 of my hair' since I was in high school Repeat labs including tsh, cbc, vits etc       Relevant Orders   Hemoglobin A1c   Cortisol   B12 and Folate Panel    Vitamin D (25 hydroxy)   Other fatigue    Ongoing fatigue; reports little/no weight changes despite efforts Ongoing fatigue Concern for cortisol and vitamins       Relevant Orders   CBC with Differential/Platelet   Comprehensive Metabolic Panel (CMET)   TSH   Hemoglobin A1c   Cortisol   B12 and Folate Panel   Vitamin D (25 hydroxy)   Surveillance for birth control, oral contraceptives - Primary    Denies need for STI or pregnancy testing Request for refills No complaints at this time       Relevant Medications   Levonorgestrel-Ethinyl Estradiol (AMETHIA) 0.15-0.03 &0.01 MG tablet   Return in about 1 year (around 02/06/2024), or if symptoms worsen or fail to improve.     Leilani Merl, FNP, have reviewed all documentation for this visit. The documentation on 02/06/23 for the exam, diagnosis, procedures, and orders are all accurate and complete.  Jacky Kindle, FNP  [facility] [phone] (phone) [phone] (fax)  [facility]

## 2023-02-06 NOTE — Assessment & Plan Note (Signed)
Ongoing fatigue; reports little/no weight changes despite efforts Ongoing fatigue Concern for cortisol and vitamins

## 2023-02-06 NOTE — Assessment & Plan Note (Signed)
Denies need for STI or pregnancy testing Request for refills No complaints at this time

## 2023-02-07 ENCOUNTER — Encounter: Payer: Self-pay | Admitting: Family Medicine

## 2023-02-07 LAB — COMPREHENSIVE METABOLIC PANEL
Albumin: 4.1 g/dL (ref 4.0–5.0)
Bilirubin Total: 0.3 mg/dL (ref 0.0–1.2)
Chloride: 105 mmol/L (ref 96–106)
Creatinine, Ser: 0.81 mg/dL (ref 0.57–1.00)
Globulin, Total: 2.5 g/dL (ref 1.5–4.5)
Glucose: 82 mg/dL (ref 70–99)
Potassium: 4.5 mmol/L (ref 3.5–5.2)
Sodium: 140 mmol/L (ref 134–144)
Total Protein: 6.6 g/dL (ref 6.0–8.5)
eGFR: 106 mL/min/{1.73_m2} (ref >59)

## 2023-02-07 LAB — VITAMIN D 25 HYDROXY (VIT D DEFICIENCY, FRACTURES): Vit D, 25-Hydroxy: 54.4 ng/mL (ref 30.0–100.0)

## 2023-02-07 LAB — CBC WITH DIFFERENTIAL/PLATELET
EOS (ABSOLUTE): 0.1 10*3/uL (ref 0.0–0.4)
Hematocrit: 41.6 % (ref 34.0–46.6)
Immature Grans (Abs): 0 10*3/uL (ref 0.0–0.1)
Lymphs: 33 %
MCHC: 33.7 g/dL (ref 31.5–35.7)
Neutrophils Absolute: 4.2 10*3/uL (ref 1.4–7.0)
Platelets: 259 10*3/uL (ref 150–450)

## 2023-02-07 LAB — TSH: TSH: 4.63 u[IU]/mL — ABNORMAL HIGH (ref 0.450–4.500)

## 2023-02-07 LAB — HEMOGLOBIN A1C: Est. average glucose Bld gHb Est-mCnc: 103 mg/dL

## 2023-02-07 NOTE — Progress Notes (Signed)
Subclinical hypothyroidism; elevated cortisol. B vitamins pending. Continue to work on stress management and regular meals, exercise, healthy sleep patterns.

## 2023-02-08 LAB — COMPREHENSIVE METABOLIC PANEL
ALT: 15 IU/L (ref 0–32)
AST: 15 IU/L (ref 0–40)
Alkaline Phosphatase: 51 IU/L (ref 44–121)
BUN/Creatinine Ratio: 14 (ref 9–23)
BUN: 11 mg/dL (ref 6–20)
CO2: 20 mmol/L (ref 20–29)
Calcium: 9.5 mg/dL (ref 8.7–10.2)

## 2023-02-08 LAB — CBC WITH DIFFERENTIAL/PLATELET
Basophils Absolute: 0.1 10*3/uL (ref 0.0–0.2)
Basos: 1 %
Eos: 2 %
Hemoglobin: 14 g/dL (ref 11.1–15.9)
Immature Granulocytes: 0 %
Lymphocytes Absolute: 2.3 10*3/uL (ref 0.7–3.1)
MCH: 31.3 pg (ref 26.6–33.0)
MCV: 93 fL (ref 79–97)
Monocytes Absolute: 0.5 10*3/uL (ref 0.1–0.9)
Monocytes: 6 %
Neutrophils: 58 %
RBC: 4.47 x10E6/uL (ref 3.77–5.28)
RDW: 12.1 % (ref 11.7–15.4)
WBC: 7.1 10*3/uL (ref 3.4–10.8)

## 2023-02-08 LAB — HEMOGLOBIN A1C: Hgb A1c MFr Bld: 5.2 % (ref 4.8–5.6)

## 2023-02-08 LAB — B12 AND FOLATE PANEL
Folate: 5.8 ng/mL (ref >3.0)
Vitamin B-12: 228 pg/mL — ABNORMAL LOW (ref 232–1245)

## 2023-02-08 LAB — CORTISOL: Cortisol: 38.5 ug/dL — ABNORMAL HIGH (ref 6.2–19.4)

## 2023-02-25 ENCOUNTER — Other Ambulatory Visit: Payer: Self-pay | Admitting: Family Medicine

## 2023-02-25 DIAGNOSIS — R7989 Other specified abnormal findings of blood chemistry: Secondary | ICD-10-CM

## 2023-04-16 DIAGNOSIS — R946 Abnormal results of thyroid function studies: Secondary | ICD-10-CM | POA: Diagnosis not present

## 2023-04-16 DIAGNOSIS — L709 Acne, unspecified: Secondary | ICD-10-CM | POA: Diagnosis not present

## 2023-04-16 DIAGNOSIS — R7989 Other specified abnormal findings of blood chemistry: Secondary | ICD-10-CM | POA: Diagnosis not present

## 2023-04-22 DIAGNOSIS — R7989 Other specified abnormal findings of blood chemistry: Secondary | ICD-10-CM | POA: Diagnosis not present

## 2023-05-15 DIAGNOSIS — H52223 Regular astigmatism, bilateral: Secondary | ICD-10-CM | POA: Diagnosis not present

## 2023-05-15 DIAGNOSIS — H5203 Hypermetropia, bilateral: Secondary | ICD-10-CM | POA: Diagnosis not present

## 2023-05-21 DIAGNOSIS — R7989 Other specified abnormal findings of blood chemistry: Secondary | ICD-10-CM | POA: Diagnosis not present

## 2023-07-01 DIAGNOSIS — H6120 Impacted cerumen, unspecified ear: Secondary | ICD-10-CM | POA: Diagnosis not present

## 2023-07-01 DIAGNOSIS — Z1330 Encounter for screening examination for mental health and behavioral disorders, unspecified: Secondary | ICD-10-CM | POA: Diagnosis not present

## 2023-08-28 ENCOUNTER — Telehealth: Payer: Self-pay

## 2023-08-28 NOTE — Telephone Encounter (Signed)
Patient states she has a very low pain tolerance and is inquiring if our office does IUD placement under local/general anesthesia. Advised can be scheduled outpatient (SDS) if needed. She will need to schedule appointment w/MD as CNM's do not perform this service. Patient will check with insurance and call back to schedule appointment for consult if desired.

## 2023-09-01 DIAGNOSIS — Z3009 Encounter for other general counseling and advice on contraception: Secondary | ICD-10-CM | POA: Diagnosis not present

## 2023-09-15 DIAGNOSIS — J029 Acute pharyngitis, unspecified: Secondary | ICD-10-CM | POA: Diagnosis not present

## 2023-09-30 DIAGNOSIS — J029 Acute pharyngitis, unspecified: Secondary | ICD-10-CM | POA: Diagnosis not present

## 2023-09-30 DIAGNOSIS — H6121 Impacted cerumen, right ear: Secondary | ICD-10-CM | POA: Diagnosis not present

## 2023-09-30 DIAGNOSIS — Z1330 Encounter for screening examination for mental health and behavioral disorders, unspecified: Secondary | ICD-10-CM | POA: Diagnosis not present

## 2023-10-14 DIAGNOSIS — Z113 Encounter for screening for infections with a predominantly sexual mode of transmission: Secondary | ICD-10-CM | POA: Diagnosis not present

## 2023-10-14 DIAGNOSIS — Z124 Encounter for screening for malignant neoplasm of cervix: Secondary | ICD-10-CM | POA: Diagnosis not present

## 2023-10-14 DIAGNOSIS — Z30430 Encounter for insertion of intrauterine contraceptive device: Secondary | ICD-10-CM | POA: Diagnosis not present

## 2023-10-29 DIAGNOSIS — J029 Acute pharyngitis, unspecified: Secondary | ICD-10-CM | POA: Diagnosis not present

## 2023-12-08 ENCOUNTER — Encounter: Payer: Self-pay | Admitting: Physician Assistant

## 2023-12-08 ENCOUNTER — Ambulatory Visit: Admitting: Physician Assistant

## 2023-12-08 VITALS — BP 98/67 | HR 68 | Temp 98.3°F | Resp 16 | Ht 65.0 in | Wt 171.2 lb

## 2023-12-08 DIAGNOSIS — J069 Acute upper respiratory infection, unspecified: Secondary | ICD-10-CM

## 2023-12-08 LAB — POC COVID19/FLU A&B COMBO
Covid Antigen, POC: NEGATIVE
Influenza A Antigen, POC: NEGATIVE
Influenza B Antigen, POC: NEGATIVE

## 2023-12-08 MED ORDER — NEOMYCIN-POLYMYXIN-HC 1 % OT SOLN: 3.0000 [drp] | OTIC | 0 refills | Status: AC

## 2023-12-08 MED ORDER — FLUTICASONE PROPIONATE 50 MCG/ACT NA SUSP: 2.0000 | Freq: Every day | NASAL | 6 refills | Status: AC

## 2023-12-08 NOTE — Progress Notes (Signed)
 Established patient visit  Patient: Victoria Webb   DOB: 2000-11-24   23 y.o. Female  MRN: [id number] Visit Date: 12/08/2023  Today's healthcare provider: Blane Bunting, PA-C   Chief Complaint  Patient presents with   Cough       Subjective      History of Present Illness  Possible cold' flu? X2 weeks .Aaron AasPossible  ear infection Endorses having cough, nasal congestion , sore throat with cough. Denies having fever.      02/06/2023    9:32 AM 03/06/2022   11:15 AM 09/10/2018    2:19 PM  Depression screen PHQ 2/9  Decreased Interest 0 0 0  Down, Depressed, Hopeless 0 0 0  PHQ - 2 Score 0 0 0  Altered sleeping 1 1 1   Tired, decreased energy 1 1 1   Change in appetite 1 1 1   Feeling bad or failure about yourself  0 0 0  Trouble concentrating 0 0 0  Moving slowly or fidgety/restless 0 0 0  Suicidal thoughts 0 0 0  PHQ-9 Score 3 3 3   Difficult doing work/chores Not difficult at all Not difficult at all Not difficult at all       No data to display          Medications: Outpatient Medications Prior to Visit  Medication Sig   Levonorgestrel -Ethinyl Estradiol (AMETHIA) 0.15-0.03 &0.01 MG tablet Take 1 tablet by mouth daily.   spironolactone  (ALDACTONE ) 25 MG tablet Take 1 tablet (25 mg total) by mouth daily.   No facility-administered medications prior to visit.    Review of Systems All negative Except see HPI       Objective    BP 98/67 (BP Location: Right Arm, Patient Position: Sitting, Cuff Size: Normal)   Pulse 68   Temp 98.3 F (36.8 C) (Oral)   Resp 16   Ht 5\' 5"  (1.651 m)   Wt 171 lb 3.2 oz (77.7 kg)   SpO2 98%   BMI 28.49 kg/m     Physical Exam Vitals reviewed.  Constitutional:      Appearance: She is normal weight.  HENT:     Head: Normocephalic and atraumatic.     Right Ear: Ear canal and external ear normal. There is impacted cerumen (partially).     Left Ear: Ear canal and external ear normal. There is impacted cerumen  (partially).     Nose: Congestion and rhinorrhea present.     Mouth/Throat:     Pharynx: Posterior oropharyngeal erythema present.     Comments: Postnasal drainage noted Eyes:     General: No scleral icterus.       Right eye: No discharge.        Left eye: No discharge.     Extraocular Movements: Extraocular movements intact.     Pupils: Pupils are equal, round, and reactive to light.  Cardiovascular:     Rate and Rhythm: Normal rate and regular rhythm.  Pulmonary:     Effort: Pulmonary effort is normal.     Breath sounds: Normal breath sounds.  Abdominal:     General: Abdomen is flat. Bowel sounds are normal.     Palpations: Abdomen is soft.  Musculoskeletal:     Cervical back: Normal range of motion.  Lymphadenopathy:     Cervical: Cervical adenopathy present.  Neurological:     Mental Status: She is alert.      No results found for any visits on 12/08/23.      Assessment & Plan  Cough, unspecified type Upper respiratory tract infection, unspecified type Most likely viral etiology Endorses nasal congestion, ear pain, cough Started a day ago. Had an URI 2 weeks, symptoms resolved. Yesterday, symptoms returned. No fever. Denies having sob, wheezing Normal vital and normal lung exam  Symptomatic treatment advised: Increase fluids.  Rest.  Saline nasal spray.  Mucinex as directed.  Humidifier in bedroom. Flonase per orders.  Warm salt water gargle and nasal saline rinse  The patient was advised to call back or seek an in-person evaluation if the symptoms worsen or if the condition fails to improve as anticipated.   No orders of the defined types were placed in this encounter.   No follow-ups on file.   The patient was advised to call back or seek an in-person evaluation if the symptoms worsen or if the condition fails to improve as anticipated.  I discussed the assessment and treatment plan with the patient. The patient was provided an opportunity to ask  questions and all were answered. The patient agreed with the plan and demonstrated an understanding of the instructions.  I, Wendy Hoback, PA-C have reviewed all documentation for this visit. The documentation on 12/08/2023  for the exam, diagnosis, procedures, and orders are all accurate and complete.  Blane Bunting, [facility], MMS [facility] [phone] (phone) [phone] (fax)  [facility]

## 2023-12-11 DIAGNOSIS — Z30431 Encounter for routine checking of intrauterine contraceptive device: Secondary | ICD-10-CM | POA: Diagnosis not present

## 2023-12-11 DIAGNOSIS — T8332XA Displacement of intrauterine contraceptive device, initial encounter: Secondary | ICD-10-CM | POA: Diagnosis not present

## 2024-06-02 DIAGNOSIS — F4323 Adjustment disorder with mixed anxiety and depressed mood: Secondary | ICD-10-CM | POA: Diagnosis not present

## 2024-06-16 DIAGNOSIS — F4323 Adjustment disorder with mixed anxiety and depressed mood: Secondary | ICD-10-CM | POA: Diagnosis not present

## 2024-07-14 DIAGNOSIS — F4323 Adjustment disorder with mixed anxiety and depressed mood: Secondary | ICD-10-CM | POA: Diagnosis not present

## 2024-07-27 DIAGNOSIS — F4323 Adjustment disorder with mixed anxiety and depressed mood: Secondary | ICD-10-CM | POA: Diagnosis not present
